# Patient Record
Sex: Female | Born: 1935 | Race: White | Hispanic: No
Health system: Southern US, Community
[De-identification: ages and names within clinical notes are randomized; demographics above are authoritative.]

---

## 1997-06-29 ENCOUNTER — Other Ambulatory Visit: Admission: RE | Admit: 1997-06-29 | Discharge: 1997-06-29 | Payer: Self-pay | Admitting: Hematology and Oncology

## 1999-01-18 ENCOUNTER — Encounter (INDEPENDENT_AMBULATORY_CARE_PROVIDER_SITE_OTHER): Payer: Self-pay | Admitting: Specialist

## 1999-01-18 ENCOUNTER — Ambulatory Visit (HOSPITAL_COMMUNITY): Admission: RE | Admit: 1999-01-18 | Discharge: 1999-01-18 | Payer: Self-pay | Admitting: Gastroenterology

## 1999-12-05 ENCOUNTER — Encounter: Admission: RE | Admit: 1999-12-05 | Discharge: 1999-12-05 | Payer: Self-pay | Admitting: Hematology and Oncology

## 1999-12-05 ENCOUNTER — Encounter: Payer: Self-pay | Admitting: Hematology and Oncology

## 2000-10-22 ENCOUNTER — Ambulatory Visit (HOSPITAL_COMMUNITY): Admission: RE | Admit: 2000-10-22 | Discharge: 2000-10-22 | Payer: Self-pay | Admitting: Hematology and Oncology

## 2000-10-22 ENCOUNTER — Encounter: Payer: Self-pay | Admitting: Hematology and Oncology

## 2001-11-06 ENCOUNTER — Inpatient Hospital Stay (HOSPITAL_COMMUNITY): Admission: AD | Admit: 2001-11-06 | Discharge: 2001-11-08 | Payer: Self-pay | Admitting: Cardiology

## 2001-11-06 ENCOUNTER — Encounter: Payer: Self-pay | Admitting: Cardiology

## 2002-04-27 ENCOUNTER — Ambulatory Visit (HOSPITAL_COMMUNITY): Admission: RE | Admit: 2002-04-27 | Discharge: 2002-04-27 | Payer: Self-pay | Admitting: Hematology & Oncology

## 2002-04-27 ENCOUNTER — Encounter: Payer: Self-pay | Admitting: Hematology & Oncology

## 2003-04-22 ENCOUNTER — Encounter (INDEPENDENT_AMBULATORY_CARE_PROVIDER_SITE_OTHER): Payer: Self-pay | Admitting: Specialist

## 2003-04-22 ENCOUNTER — Ambulatory Visit (HOSPITAL_COMMUNITY): Admission: RE | Admit: 2003-04-22 | Discharge: 2003-04-22 | Payer: Self-pay | Admitting: Gastroenterology

## 2004-03-03 ENCOUNTER — Emergency Department (HOSPITAL_COMMUNITY): Admission: EM | Admit: 2004-03-03 | Discharge: 2004-03-04 | Payer: Self-pay | Admitting: Emergency Medicine

## 2004-03-07 ENCOUNTER — Ambulatory Visit: Payer: Self-pay | Admitting: Psychiatry

## 2004-03-07 ENCOUNTER — Inpatient Hospital Stay (HOSPITAL_COMMUNITY): Admission: RE | Admit: 2004-03-07 | Discharge: 2004-03-24 | Payer: Self-pay | Admitting: Psychiatry

## 2004-03-17 ENCOUNTER — Ambulatory Visit (HOSPITAL_COMMUNITY): Admission: RE | Admit: 2004-03-17 | Discharge: 2004-03-17 | Payer: Self-pay | Admitting: Internal Medicine

## 2006-07-23 ENCOUNTER — Inpatient Hospital Stay (HOSPITAL_COMMUNITY): Admission: EM | Admit: 2006-07-23 | Discharge: 2006-07-26 | Payer: Self-pay | Admitting: *Deleted

## 2007-02-15 ENCOUNTER — Emergency Department (HOSPITAL_COMMUNITY): Admission: EM | Admit: 2007-02-15 | Discharge: 2007-02-15 | Payer: Self-pay | Admitting: *Deleted

## 2007-03-07 ENCOUNTER — Emergency Department (HOSPITAL_COMMUNITY): Admission: EM | Admit: 2007-03-07 | Discharge: 2007-03-07 | Payer: Self-pay | Admitting: Emergency Medicine

## 2009-08-04 IMAGING — CR DG ABDOMEN ACUTE W/ 1V CHEST
3 series · 3 of 3 positions shown · non-contrast
Comparison: Chest, 03/17/2004

CLINICAL DATA: Chest pain.  
 ACUTE ABDOMINAL SERIES ? 3 VIEW:

[w chest pa]
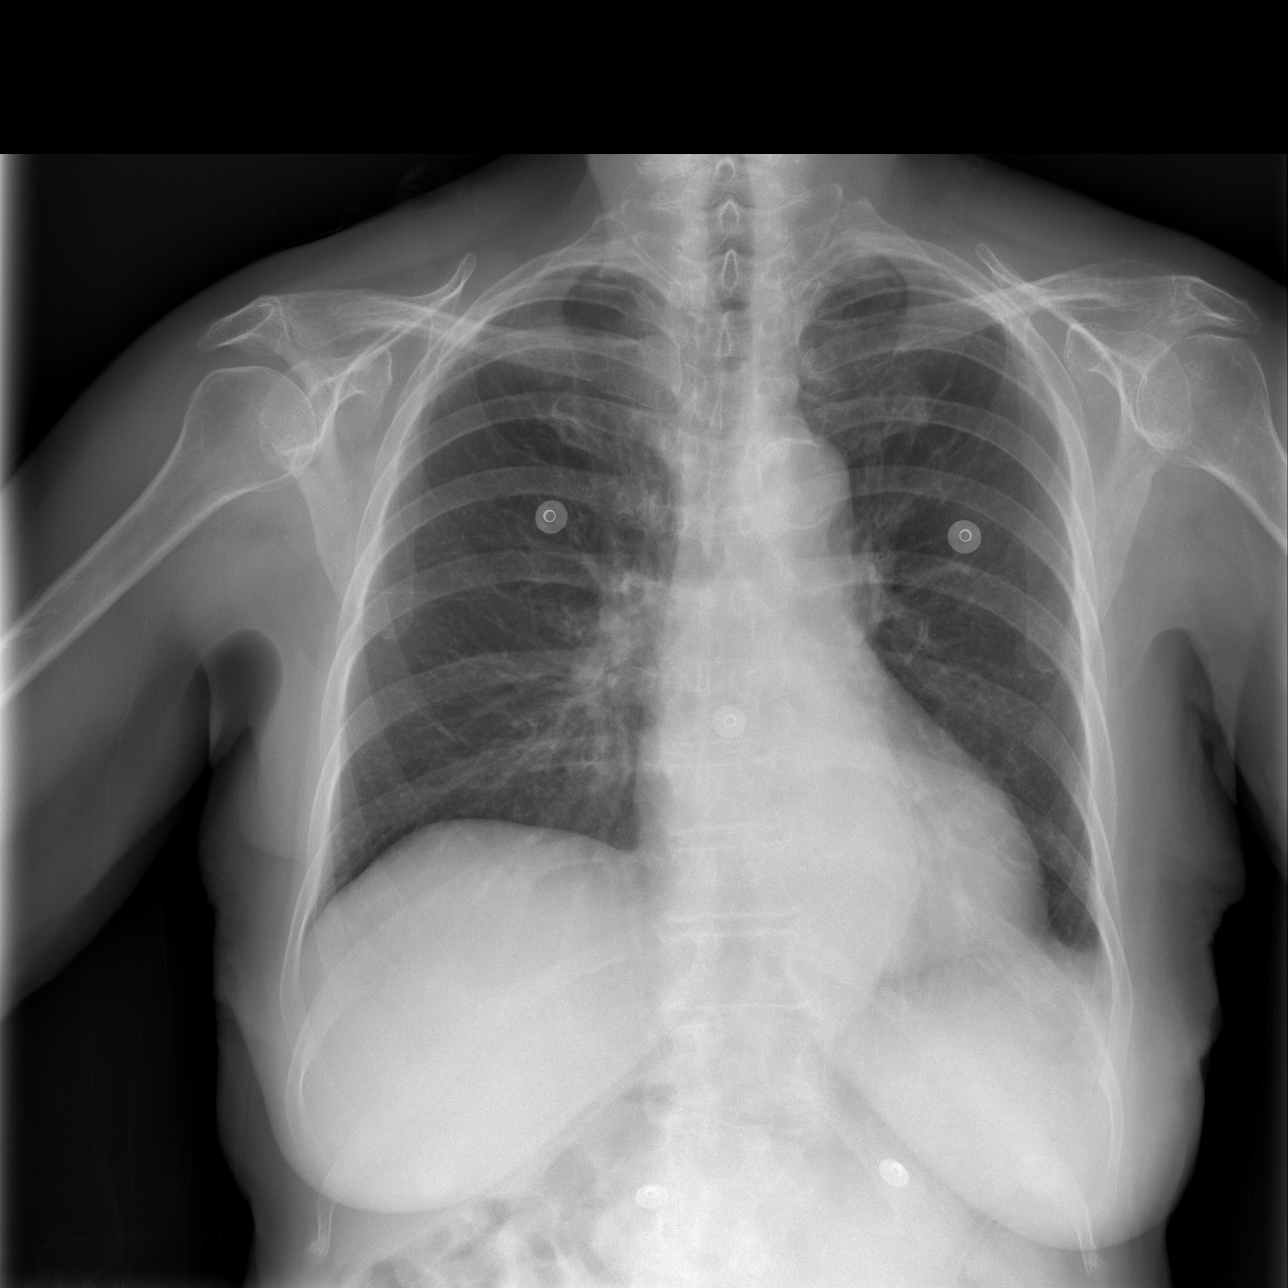

[w abdomen upright *]
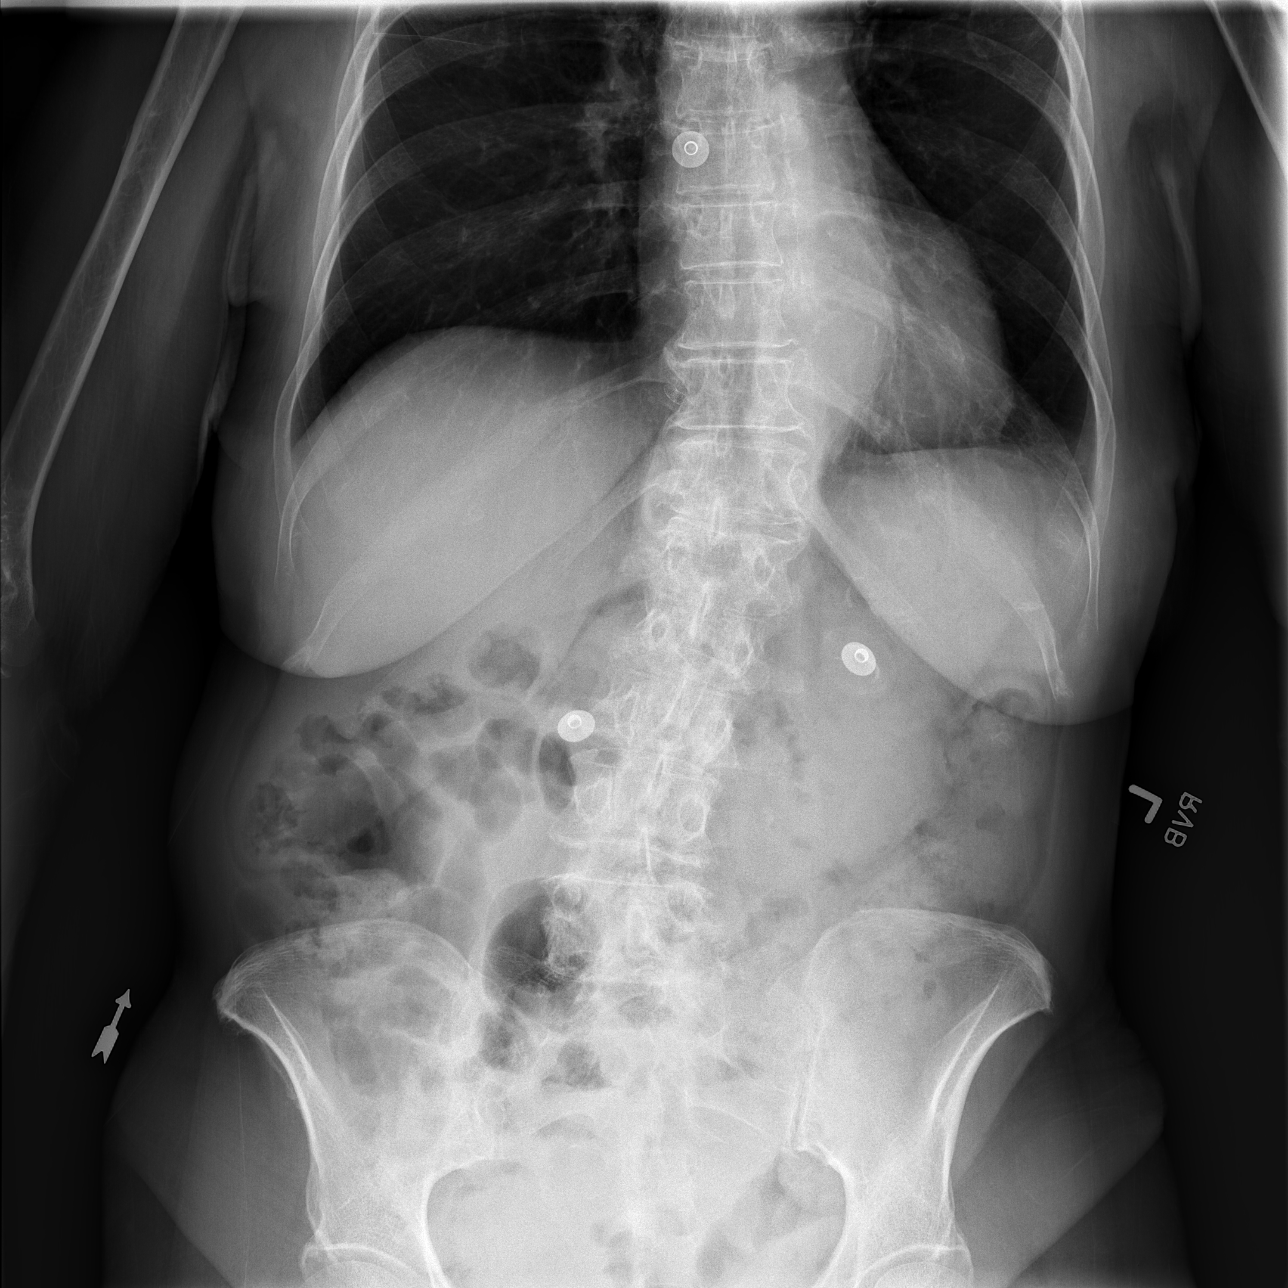

[t abdomen supine]
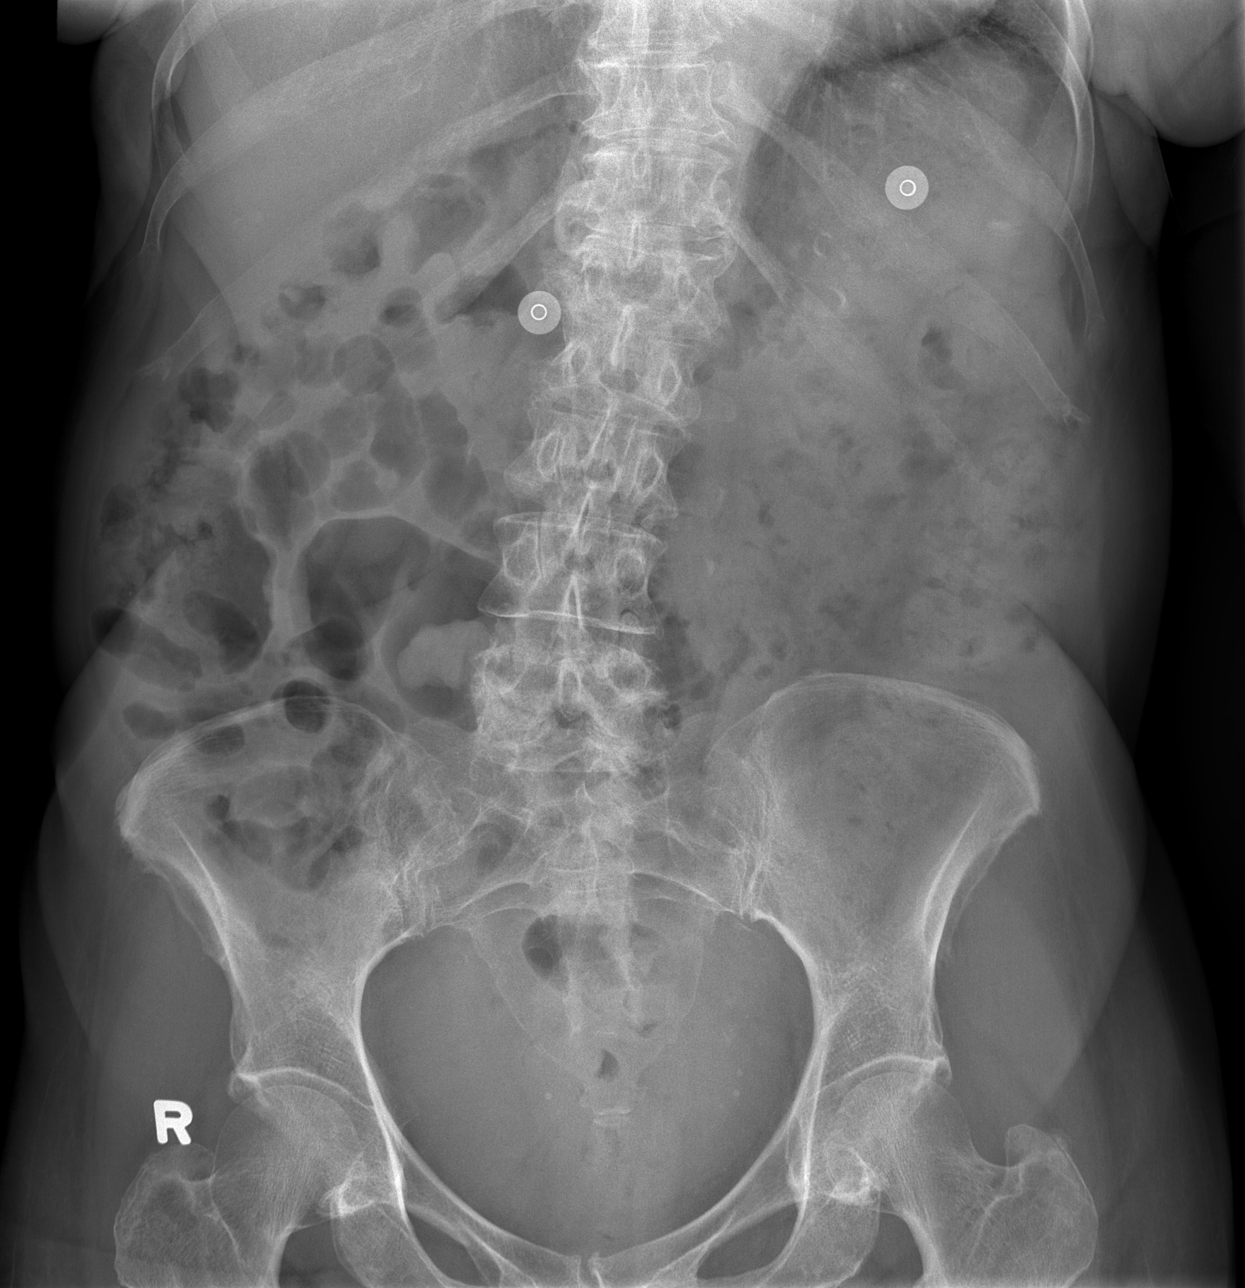

[3 of 3 positions shown; findings below may reference images not displayed]

FINDINGS: No infiltrate, congestive heart failure, or pneumothorax.  Calcified tortuous aorta.  Heart size top normal. Nonspecific bowel gas pattern without evidence of obstruction or free intraperitoneal air.  Levoscoliosis thoracolumbar junction.
IMPRESSION: No      obstruction or free intraperitoneal air.  
  Calcified      tortuous aorta. 
  Levoscoliosis,      thoracolumbar junction.

## 2009-08-24 IMAGING — CT CT HEAD W/O CM
1 series · 16 of 30 positions shown, 20 images · IV contrast (agent unspecified)
Comparison: 02/15/07.

CLINICAL DATA: Altered level of consciousness.  
 HEAD CT WITHOUT CONTRAST:
TECHNIQUE: Contiguous axial images were obtained from the base of the skull through the vertex according to standard protocol without contrast.

[Series 2: headseq 4.8 h45s · axial · 0.43mm/px · z∈[+1214,+1347]mm · 16 of 30 slices shown, 20 images]
[im 2/30  brain]
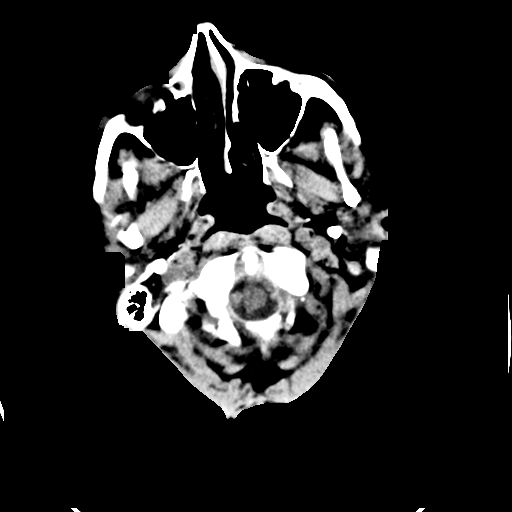
[im 2/30  bone]
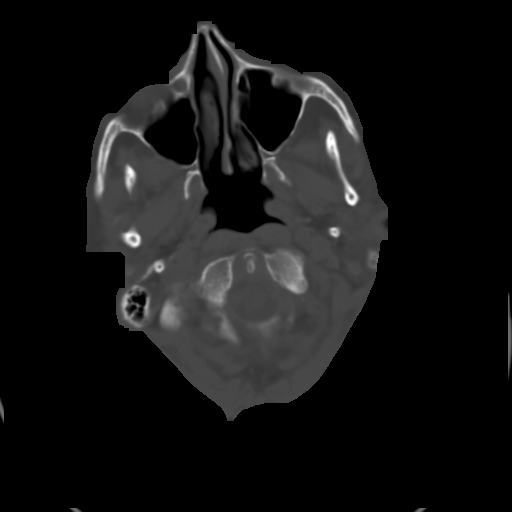
[im 4/30  brain]
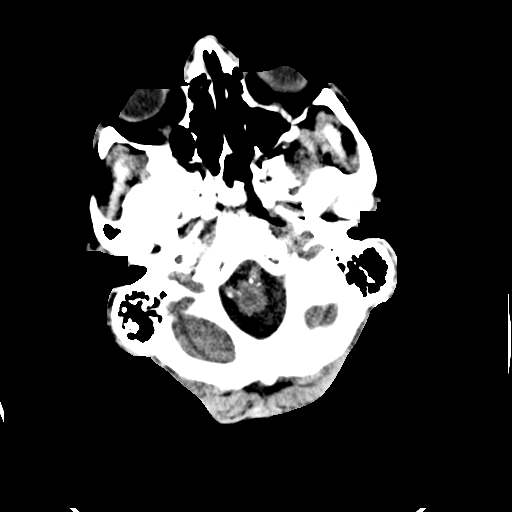
[im 6/30  brain]
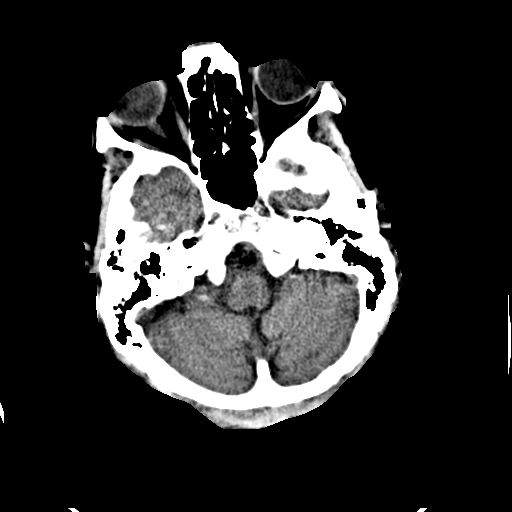
[im 8/30  brain]
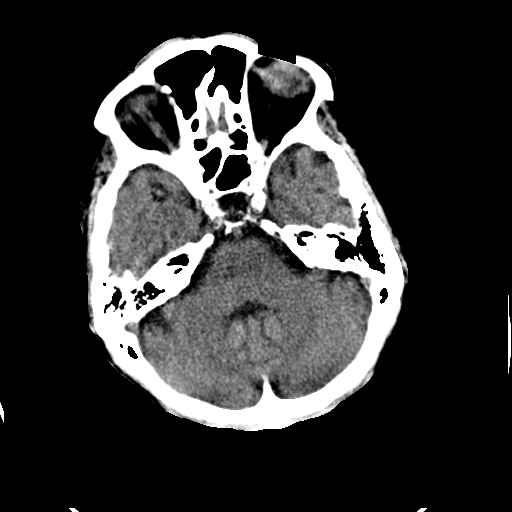
[im 9/30  brain]
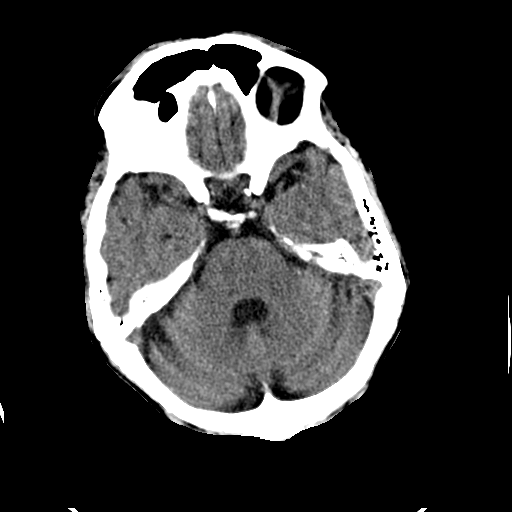
[im 9/30  bone]
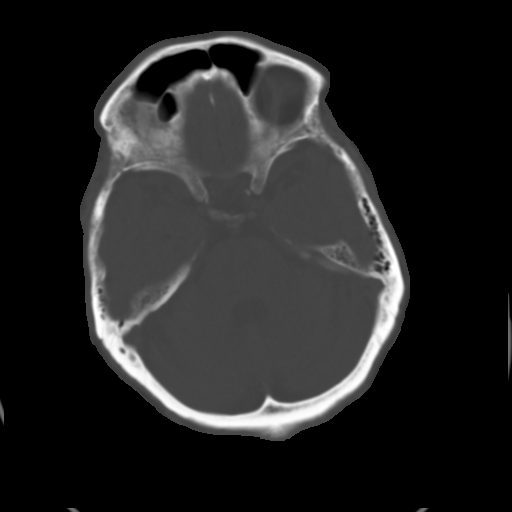
[im 11/30  brain]
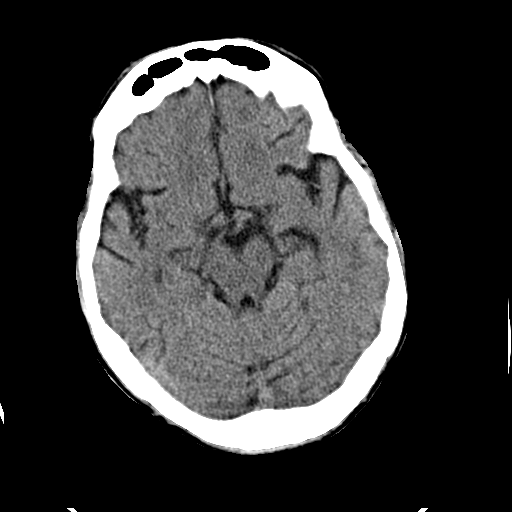
[im 13/30  brain]
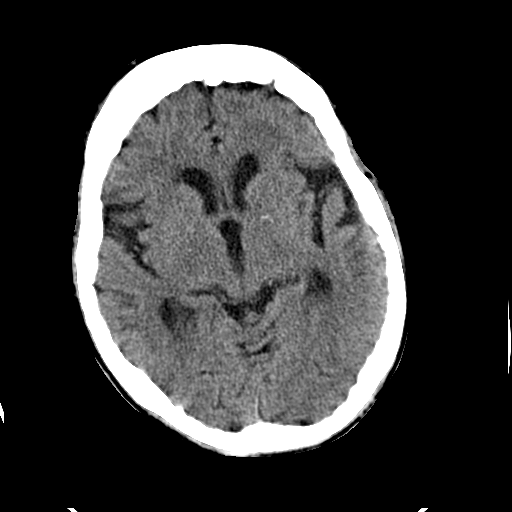
[im 15/30  brain]
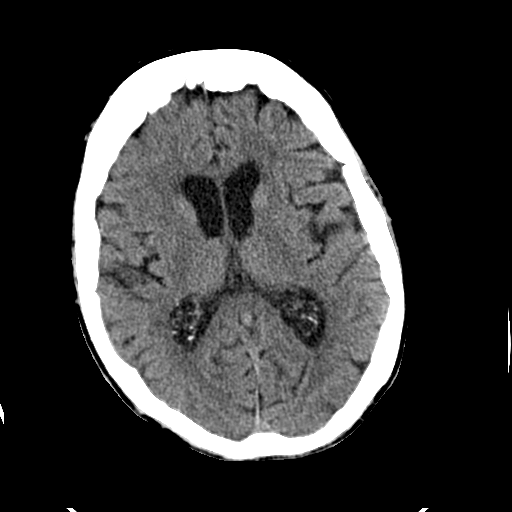
[im 16/30  brain]
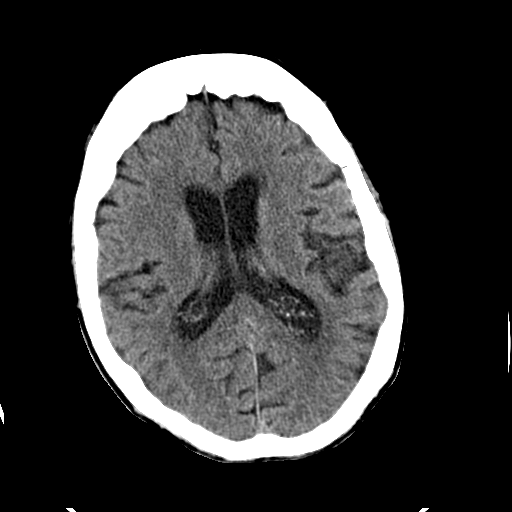
[im 16/30  bone]
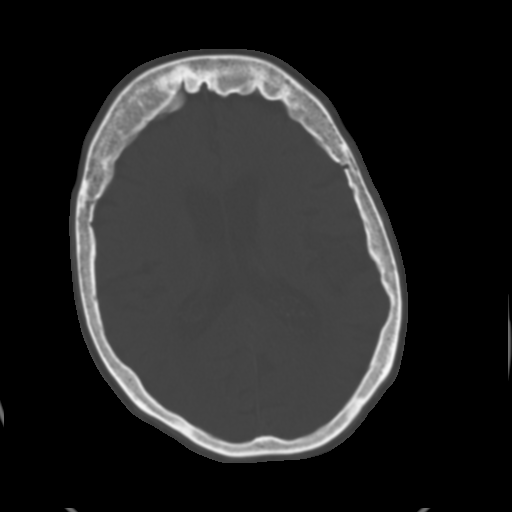
[im 18/30  brain]
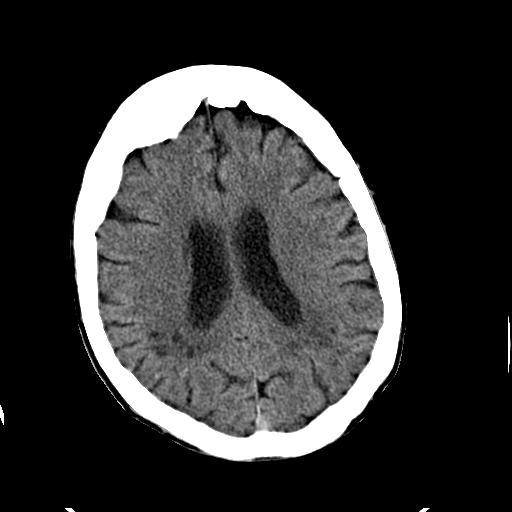
[im 20/30  brain]
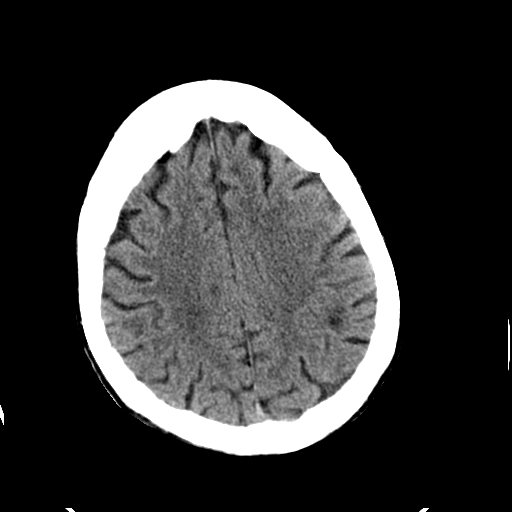
[im 22/30  brain]
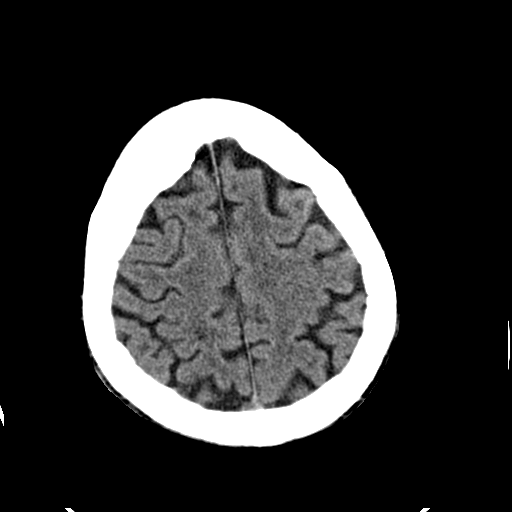
[im 23/30  brain]
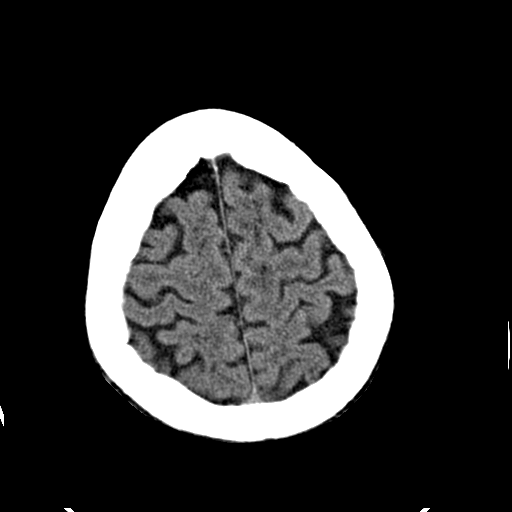
[im 23/30  bone]
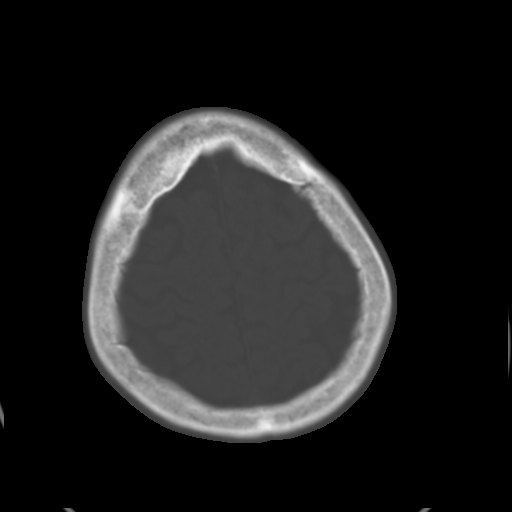
[im 25/30  brain]
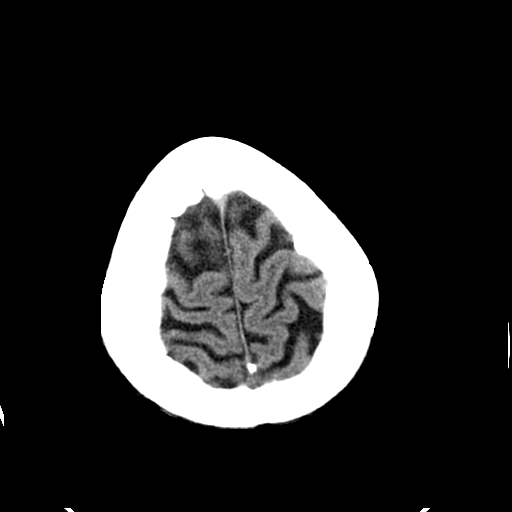
[im 27/30  brain]
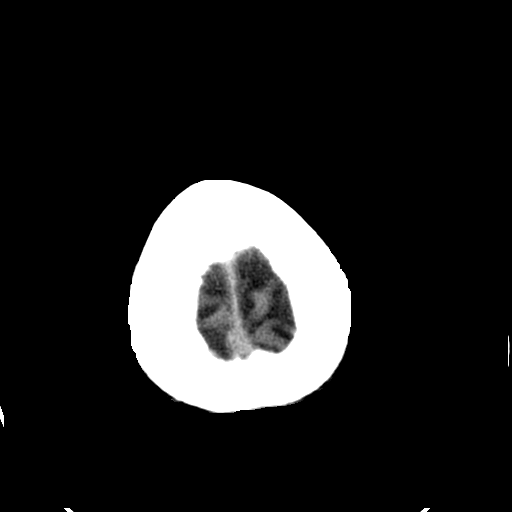
[im 29/30  brain]
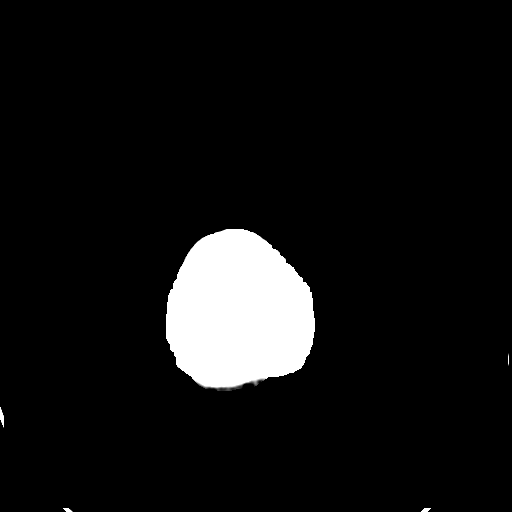

[16 of 30 positions shown; findings below may reference images not displayed]

FINDINGS: Cortical volume loss and periventricular white matter hypodensity is stable.  No acute infarct, hemorrhage or mass is seen.  Posterior fossa is unremarkable.  Orbits and paranasal sinuses are intact.  No skull fracture.
IMPRESSION: No acute intracranial findings.  Chronic findings as above, unchanged.

## 2010-04-08 ENCOUNTER — Encounter (HOSPITAL_COMMUNITY): Payer: Self-pay | Admitting: Psychiatry

## 2010-08-04 NOTE — Consult Note (Signed)
[name], Meghan               ACCOUNT NO.:  [id number]   MEDICAL RECORD NO.:  [id number]          PATIENT TYPE:  INP   LOCATION:  1605                         FACILITY:  [facility]   PHYSICIAN:  Antonietta Breach, M.D.  DATE OF BIRTH:  [birth year]   DATE OF CONSULTATION:  07/24/2006  DATE OF DISCHARGE:  07/26/2006                                 CONSULTATION   REASON FOR CONSULTATION:  Mental status changes including  hallucinations, assess capacity, recommend treatment.   HISTORY OF PRESENT ILLNESS:  Meghan Porter is an 77 year old female  admitted to the [facility] on Jul 23, 2006, for inability to  walk and feeling weak.   Meghan Porter has had approximately 2 weeks of worsening confusion on top  of a long-term memory impairment.  She has been started on Aricept  recently at 5 mg daily   The daughter stopped her Aricept due to due to concerns about the  Aricept contributing to her weakness and dysphoria.  Because the  patient's confusion continued to worsen, she was brought in to the  emergency room.  She is now admitted to the medical ward.   In addition to confusion, the patient has had expressive dysphasia.  She  also has had inability to walk with a shuffled gait and minor fall.   The patient does continue with hallucinations as well as partially  disorganized thought process.  She does have significant memory  impairment.   MEDICATIONS:  1. Risperdal 1 mg daily.  2. Depakote 250 mg daily.  3. Zoloft 100 mg daily.  4. Ambien 5 mg q.h.s.  5. Ativan 0.5 to 1 mg p.r.n. anxiety/agitation.   She is not having any adverse psychotropic effects.   PAST PSYCHIATRIC HISTORY:  The past medical record is reviewed.  The  patient has a history of depression.  It has been treated with  antidepressant therapy in the past.  She has been on Zoloft at least as  far back as August 2003 in the medical record.  At that time, she was  also on Xanax.   She was admitted to the  [facility] in December 2005  with confusion, increased anxiety and obsessions about something  happening to a loved one.  She was stabilized on Risperdal, Zoloft,  Xanax.   FAMILY PSYCHIATRIC HISTORY:  None known   SOCIAL HISTORY:  The patient is widowed.  She lived alone until  recently.  Occupation:  Retired Catering manager.  Religion:  [employer].  She  has a daughter who looks in on her.  She does not have any history of  alcohol or illegal drug use.   PAST MEDICAL HISTORY:  1. 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  2. There is a presumed heart dyskinesia related to the use of      antipsychotic medication.  3. Hypertension.  4. Hyperlipidemia.  5. Diabetes.  6. Coronary artery disease with a history of stenting.   MEDICATIONS:  The MAR is reviewed.  Psychotropics include those  discussed in the history of present illness.   ALLERGIES:  ASPIRIN.  LABORATORY DATA:  WBC 7.5, hemoglobin 12.3, platelet count 283, INR 1.1,  BUN 33, creatinine 1.37, SGOT 35, SGPT 22, albumin 4.1, calcium 9.4.  TSH within normal limits.  Head CT without contrast shows likely  atrophy.  An MRI without contrast of the brain showed atrophy.   REVIEW OF SYSTEMS:  CONSTITUTIONAL:  Afebrile.  There is no current  weight loss.  HEAD:  No trauma.  EYES:  No visual changes.  EARS:  No  hearing impairment NOSE:  No rhinorrhea.  MOUTH/THROAT:  No sore throat.  NEUROLOGIC:  As above.  PSYCHIATRIC: The the patient does have a history  of recurrent psychosis which has involved paranoid delusions.  She has  been followed by an outpatient psychiatrist.  She also has a history of  memory loss and loss of judgment.  CARDIOVASCULAR:  No chest pain or  palpitations.  RESPIRATORY:  No coughing or wheezing.  GASTROINTESTINAL:  No nausea, vomiting, diarrhea.  GENITOURINARY:  No dysuria.  SKIN:  Unremarkable.  HEMATOLOGICAL/LYMPHATIC:  Unremarkable.  MUSCULOSKELETAL:  No deformities.    PHYSICAL EXAMINATION:  VITAL SIGNS:  Temperature 97.4, pulse 73,  respirations 18, blood pressure 157/105, O2 saturation on room air 94%.  GENERAL APPEARANCE:  Meghan Porter is an elderly female lying in a supine  position in her hospital bed with no abnormal movements.  She has a  slightly decreased psychomotor tone   MENTAL STATUS EXAM:  [name] is alert.  She has a decreased attention  span.  On orientation testing she does not know the year or the month.  She does know the place.  Memory testing 3/3 immediate, only 1/3 at 3  minutes.  She has difficulty with word finding.  Her fund of knowledge  and intelligence are below that of her estimated premorbid baseline.  Her speech involves normal rate and prosody without dysarthria.   Concentration is mildly decreased.  Thought process involves partial  confabulation without looseness of associations.  Her thought content is  remarkable for hallucinations.  There are no delusions.  She has no  thoughts of harming herself or others.  Her judgment is impaired.  Her  insight is poor.   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  (296.35)  Major depressive disorder recurrent in partial remission.  (294.9)  Unspecified persistent mental disorder NOS.  The patient has a  form of dementia.  AXIS II:  None  AXIS III:  See general medical problems above.  AXIS IV:  General medical and primary support group.  AXIS V:  40.   Meghan Porter does have critical deficits in judgment as well as reasoning.  She also has significant difficulty with memory.  She does not have the  capacity for informed consent.   RECOMMENDATIONS:  It is assumed at this time that the patient was not  taking adequate Risperdal at home.  Therefore, would continue the  current Risperdal dosage of 1 mg daily which should become effective for  antipsychosis.  The augmentation of Risperdal with Depakote specifically  for  anti-agitation can decrease the amount of neuroleptic needed  allowing Risperdal to only be for psychotic symptoms.   Would continue Zoloft 100 mg q.a.m. for antidepression.   Would periodically monitor liver function tests and a CBC for Depakote  screening.   With Risperdal, would monitor glucose periodically as well as lipid  profile.  Would also perform an abnormal involuntary movement assessment  periodically and  attempt to decrease the Risperdal to the lowest  effective dosage which allows prevention of psychotic episodes.   PRELIMINARY DISCHARGE PLANNING:  The patient will follow up with her  outpatient psychiatrist.      Antonietta Breach, M.D.  Electronically Signed     JW/MEDQ  D:  09/25/2006  T:  09/26/2006  Job:  [id number]

## 2010-08-04 NOTE — Discharge Summary (Signed)
Meghan Porter, Meghan Porter               ACCOUNT NO.:  [id number]   MEDICAL RECORD NO.:  [id number]          PATIENT TYPE:  INP   LOCATION:  1605                         FACILITY:  [facility]   PHYSICIAN:  Barry Dienes. [name], M.D.DATE OF BIRTH:  1921-09-05   DATE OF ADMISSION:  07/23/2006  DATE OF DISCHARGE:                               DISCHARGE SUMMARY   DATE OF ADMISSION:  07/23/2006.   ANTICIPATED DATE OF DISCHARGE:  07/26/2006.   PERTINENT FINDINGS:  Patient is an 77 year old white female who is well  known to me as I severe as her primary care physician. Last week she was  started on Aricept 5 mg daily do to worsening confusion and  forgetfulness.  At her office visit last week with her daughter it was  advised that if she had any problems with the Aricept and mental status  that she should stop the medication and call us.  Initially, she was  doing fine on Aricept 5 mg every p.m.  Yesterday she was somewhat weak  with dysphoria so Aricept was stopped.  On the day of admission her  daughter went to see her after work and noted that she had increase in  confusion with difficulty expressing words and a great deal of  difficulty with walking with a very shuffled gait and minor fall in her  presence without injury.  She presented to the [facility] Emergency  Room for evaluation.   See her admission history and physical for details of  her past medical  history.   MEDICATIONS PRIOR TO ADMISSION:  Atenolol 25 mg daily, Avalide 150/12.5  one tab p.o. daily, Protonix 40 mg daily, Glucophage-XR 500 mg daily,  Zoloft 100 mg daily, Cogentin 1 mg daily, Depakote ER 250 mg daily,  Restoril 15 mg p.o. q.h.s., Lipitor 20 mg daily, Risperdal 1 mg p.o.  q.p.m., aspirin 81 mg daily, multivitamin 1 tab daily, Eye caps 1 tab  daily, Aricept 5 mg every p.m. it was stopped on 05/05.   ALLERGIES:  NO KNOWN DRUG ALLERGIES.   INITIAL PHYSICAL EXAMINATION:  VITAL SIGNS:  Blood pressure 159/81,  pulse  76, respirations 18, temperature 98.1, pulse oxygen saturation 95%  on room air.  GENERAL:  She is an elderly white female who is no apparent distress  while laying fully supine in bed.  HEENT:  Within normal limits.  NECK:  Supple without jugular venous distention or carotid bruits.  CHEST:  Clear to auscultation.  HEART:  Regular rate and rhythm without significant murmur or gallop.  ABDOMEN:  Normal bowel sounds and no hepatosplenomegaly or tenderness.  EXTREMITIES:  Bilateral warmth, leg edema and pedal pulses were 1+  bilaterally.  NEUROLOGIC:  She was alert and mildly anxiously with mild word finding  difficulty.  She had a rest tremor in both upper extremities.  She was  able to move 4 extremities with normal strength.  She had intact finger  to nose testing on the right and left.  Gait was not assessed.   INITIAL LABORATORY STUDIES:  White blood cell count 7.5, hemoglobin  12.3, hematocrit 36, platelet  283, serum sodium 137, potassium 3.4,  chloride 98, carbon dioxide 29, BUN 33, creatinine 1.37, glucose 145.  Total protein 6.9, albumin 4.1, total bilirubin 1.0, calcium 9.4,  estimated glomerular filtration rate 45.  A CT scan of the head without  contrast showed no acute abnormalities, but did show global moderate  atrophy.  EKG telemetry showed a normal sinus rhythm.   HOSPITAL COURSE:  The patient was admitted to a medical bed without  telemetry.  She was seen by a neurology consultant and a psychiatric  consultant.  The neurology consultant, Kelli Hope, felt that she  had transient encephalopathy do to medications and recommended that we  rule out other possibilities such as urinary tract infection or acute  renal insufficiency.  They also recommend the addition of Thymin to her  medications.  If possible, he recommended tapering off of Cogentin and  Risperdal and continued physical therapy and occupational therapy to  improve to her gait.  The psychiatry consultant,  Antonietta Breach, noted  that she did not have the ability to give informed consent do to  dementia.  He felt that it would be reasonable to try to increase  Depakote and attempt to taper her off of Risperdal given possible side  effects from these medications.   PROCEDURES:  CT scan of the head which showed no acute abnormalities  with global atrophy and MRI of the brain that showed the following:  1. No acute stroke.  2. Moderate global atrophy.   She is also seen by physical therapy consultant who noted that she  needed maximal assistance to transfer from the bed to a wheelchair and  was only able to ambulate 3 feet with a walker and moderate assistance.  He recommended skilled nursing facility rehabilitation.   COMPLICATIONS:  None.   CONDITION ON DISCHARGE:  She was calm and slept well on Depakote 500 mg.   MOST RECENT PHYSICAL EXAMINATION:  VITAL SIGNS:  Blood pressure 142/90,  pulse 66, respirations 18, temperature 96.7, pulse oxygen saturation 96%  on room air.  GENERAL:  She is an elderly white female who is in no apparent distress  while laying full supine.  HEENT:  Within normal limits.  NECK:  Supple without jugular venous distention or carotid bruit.  CHEST:  Clear to auscultation.  HEART:  Had a regular rate and rhythm without significant murmur or  gallop.  ABDOMEN:  Normal bowel sounds and no hepatosplenomegaly or tenderness.  EXTREMITIES:  Without cyanosis, clubbing or edema.  NEUROLOGIC:  She was alert and oriented x 1, her speech was normal and  she was not having paranoid ideation.  She could move all 4 extremities  and no longer had a tremor.  Cerebellar function and gait were not re-  assessed.   MOST RECENT LABORATORY TEST:  Include white blood cell count 7.5,  hemoglobin 12.3, hematocrit 36, platelet 283, serum sodium 142,  potassium 3.3, chloride 108, carbon dioxide 28, BUN 12, creatinine 1.0, glucose 143, total protein 6.4, albumin 3.3, serum amnion  less than 8.  Urinalysis was nitrite negative with white blood cells negative and  bacteria negative.  TSH 3.61, most recent capillary blood glucose levels  were 147,142, 129.   DISCHARGE DIAGNOSES:  1. Gait instability, status post fall.  2. Physical deconditioning.  3. Tremor possibly do to Risperdal.  4. Advanced dementia.  5. Hypertension, essential, controlled.  6. Hypokalemia, presumed do to hydrochlorothiazide.  7. Gastroesophageal reflux disease.  8. Diabetes mellitus, type 2,  controlled.  9. Malnutrition.  10.Depression.  11.Insomnia.  12.Hyperlipidemia.  13.Psychotic disorder, NOS.  14.Liable mood.  15.Possible early tardive dyskinesia, improved.  16.Osteoarthritis.  17.Coronary artery disease.  18.Vitamin D Deficiency.  19.Irritable bowel syndrome.  20.Hypertension.  21.History of non-Hodgkin's lymphoma, no recurrence.  22.Anxiety.   DISCHARGE MEDICATIONS:  Atenolol 25 mg p.o. b.i.d., Avapro 150 mg p.o.  daily, Prilosec 20 mg p.o. daily, Glucophage-XR 500 mg p.o. q.p.m.,  multivitamin 1 tab p.o. daily, Eye caps 1 tab p.o. daily, Zoloft 100 mg  p.o. daily, Ambien 5 mg p.o. q.h.s., Zocor 40 mg p.o. daily, Risperdal  0.5 mg p.o. q.p.m. with possible tapering at the skilled nursing  facility, Cogentin 1 mg p.o. daily with possible tapering at the nursing  facility, Depakote ER 500 mg p.o. daily at 6 p.m., Thymin 100 mg p.o.  daily, Tylenol 650 mg p.o. t.i.d. p.r.n. pain, Ecotrin 81 mg p.o. daily,  NovoLog sliding scale insulin-ac t.i.d. as follows:  For capillary blood  glucose greater than 250 give 4 units of NovoLog subcutaneous, vitamin D  50,000 units p.o. once weekly.   DISPOSITION AND FOLLOWUP:  Patient will be transferred to a skilled  nursing facility for physical therapy and occupational therapy as well  as speech pathology evaluation to optimize her gait instability and  physical deconditioning.  She should have a followup appointment with  Dr. Jarome Matin at [facility] at telephone [phone]  following completion of her skilled nursing facility rehabilitation.  If  she is significantly impaired following acute skilled nursing facility  rehabilitation strong consideration should given to a transfer to an  assisted living facility.  Note that her point of contact is her  daughter Buel Ream at cell telephone number [phone] and home telephone  number [phone].           ______________________________  Barry Dienes. [name], M.D.     DGP/MEDQ  D:  07/26/2006  T:  07/26/2006  Job:  [id number]

## 2010-08-04 NOTE — Op Note (Signed)
NAMERANATA, Meghan Porter                         ACCOUNT NO.:  [id number]   MEDICAL RECORD NO.:  [id number]                   PATIENT TYPE:  INP   LOCATION:  4731                                 FACILITY:  [facility]   PHYSICIAN:  Florencia Reasons, M.D.             DATE OF BIRTH:  [birth year]   DATE OF PROCEDURE:  11/08/2001  DATE OF DISCHARGE:                                 OPERATIVE REPORT   PROCEDURE:  Upper endoscopy.   INDICATIONS:  This 77 year old female was admitted to the hospital with  chest pain several days ago.  She has a known history of coronary artery  disease and is previously status post stent placement; however, cardiac  catheterization on this admission did not disclose any significant blockages  to account for her pain.  Of note, the patient has a history of reflux  disease, for which she is maintained on an H2 blocker, and also she is  status post radiation therapy to the chest for a previous history of non-  Hodgkin's lymphoma, approximately five years ago.  Rule out reflux  esophagitis or radiation esophagitis.   FINDINGS:  Small hiatal hernia, otherwise normal exam.  No source of chest  pain endoscopically evident.   DESCRIPTION OF PROCEDURE:  The nature, purpose, and risks of the procedure  had been discussed with the patient, who provided written consent.  Sedation  was fentanyl 25 mcg and Versed 2.5 mg IV without arrhythmias or  desaturation.  The Olympus small-caliber adult video endoscope was passed  under direct vision, encountering slight difficulty getting past the  cricopharyngeus due to absence of patient cooperation with swallowing.  The  vocal cords appeared normal, although the laryngeal tissues around them  appeared somewhat boggy.   The esophagus was endoscopically normal and specifically without any source  of chest pain being endoscopically evident.  I saw no evidence of reflux  esophagitis or Barrett's esophagus, nor was there free reflux  occurring  during the exam.  There was no evidence of any radiation esophagitis nor any  varices, infection, or neoplasia.  The contractility of the esophagus was  probably normal, although every now and then there was a hint of some  corkscrewing suggesting possible esophageal spasm.   At the squamocolumnar junction was a widely patent esophageal ring, and  below this was a 2 cm hiatal hernia.  The stomach contained no significant  residual and had normal mucosa without evidence of gastritis, erosions,  ulcers, polyps, or masses, and a retroflexed view of the proximal stomach  was normal except for the hiatal hernia as seen from its inferior  perspective.  The pylorus, duodenal bulb, and second duodenum looked normal.   The scope was removed from the patient, who tolerated the procedure well and  without apparent complication.  No biopsies were obtained.    IMPRESSION:  Unremarkable exam.  No endoscopically-evident source of chest  pain seen.  PLAN:  No further GI evaluation is warranted at the present time.                                                Florencia Reasons, M.D.    RVB/MEDQ  D:  11/08/2001  T:  11/11/2001  Job:  [id number]   cc:   Barry Dienes. [name], M.D.   Runell Gess, M.D.   Lowell C. Catha Gosselin, M.D.  [address] [facility]  [place]  [place] [address]  Fax: [phone]   Petra Kuba, M.D.

## 2010-08-04 NOTE — Cardiovascular Report (Signed)
Meghan Porter, Meghan Porter                         ACCOUNT NO.:  [id number]   MEDICAL RECORD NO.:  [id number]                   PATIENT TYPE:  INP   LOCATION:  4731                                 FACILITY:  [facility]   PHYSICIAN:  Madaline Savage, M.D.             DATE OF BIRTH:  05/30/1921   DATE OF PROCEDURE:  11/07/2001  DATE OF DISCHARGE:                              CARDIAC CATHETERIZATION   PROCEDURES PERFORMED:  1. Selective coronary angiography by Judkins technique.  2. Retrograde left heart catheterization.  3. Left ventricular angiography.  4. Abdominal aortography.   COMPLICATIONS:  None.   ENTRY SITE:  Right femoral.   DYE USED:  Omnipaque.   PATIENT PROFILE:  The patient is a 77 year old female patient of Dr. Gery Pray,  who has had basically several months of stable chest pain sounding anginal  in the last two weeks episodes of accelerated and are definitely relieved  with sublingual nitroglycerin. The patient is diabetic and has a history of  non-Hodgkin lymphoma.   The patient had a tight lesion in her right coronary artery distally in 1998  and on February 03, 1997, underwent implantation of a 3.5 x 13 mm Multi-Link  Duet stent by Dr. Tresa Endo with excellent results.  Today, the patient enters  the catheterization lab on an inpatient basis. She was admitted yesterday  for unstable symptoms and was placed on IV nitroglycerin and heparin.   RESULTS:  PRESSURES:  The left ventricular pressure today was 155/17,  central aortic pressure 155/65, mean of 101.  No aortic valve gradient by  pullback technique.   ANGIOGRAPHIC RESULTS:  The left main coronary artery was normal.   The LAD coursed to the cardiac apex and gave rise to three very small  diagonal branches. The LAD just before the septal perforator branch  contained a subtle area of 40% lesion that appeared unchanged when compared  to old films dating back to November 1998. There was a large septal  perforator  branch immediately after the lesion that appeared normal.   The left circumflex is nondominant. One distal anterolateral branch arose.  No lesions were seen in the circumflex.   The right coronary artery was large and dominant. There was a 30% stenosis  in a midportion of a vessel unchanged from November 1998.  There was a  radioopaque stent which showed no in-stent re-stenosis whatsoever, and  showed excellent TIMI-3 class distal flow into a PDA and PLA that also  appeared normal.   ABDOMINAL AORTOGRAPHY:  Abdominal aortography was normal.  The right renal  artery contained a 30% ostial stenosis. The left renal artery was normal.   LEFT VENTRICULAR ANGIOGRAPHY:  Left ventricular angiography showed normal  contractility. Ejection fraction estimated of 65-70% and no evidence of  mitral regurgitation.   FINAL DIAGNOSES:  1. Trivial two-vessel coronary artery disease.     a. A 40% proximal left anterior descending.  b. A 30% mid right coronary artery.     c. Widely patent stent to the distal right coronary artery.  2. Excellent left ventricular function.   RECOMMENDATIONS:  The patient should be reassured about her heart.  A  gastrointestinal evaluation is recommended.                                                  Madaline Savage, M.D.    WHG/MEDQ  D:  11/07/2001  T:  11/10/2001  Job:  [id number]   cc:   Runell Gess, M.D.   Cardiac Catheterization

## 2010-08-04 NOTE — Op Note (Signed)
[name], Meghan Porter                         ACCOUNT NO.:  [id number]   MEDICAL RECORD NO.:  [id number]                   PATIENT TYPE:  AMB   LOCATION:  ENDO                                 FACILITY:  [facility]   PHYSICIAN:  Petra Kuba, M.D.                 DATE OF BIRTH:  [birth year]   DATE OF PROCEDURE:  04/22/2003  DATE OF DISCHARGE:                                 OPERATIVE REPORT   PROCEDURE:  Colonoscopy with hot biopsy.   INDICATION:  Patient with history of colon polyps, due for a repeat  screening.  Consent was signed after risks, benefits, methods, options  thoroughly discussed in the office in the past.   MEDICINES USED:  1. Demerol 70.  2. Versed 5.   DESCRIPTION OF PROCEDURE:  Rectal inspection was pertinent for external  hemorrhoids, small.  Digital exam was negative.  The video pediatric  adjustable colonoscope was inserted and fairly easily advanced around the  colon to the cecum.  This did require rolling her on her back and some  abdominal pressures.  No abnormality was seen on insertion.  The cecum was  identified by the appendiceal orifice and the ileocecal valve.  The scope  was slowly withdrawn.  The prep was adequate.  There was some liquid stool  that required washing and suctioning.  On slow withdrawal back to the left  side of the colon, no right-sided abnormalities were seen.  However, in the  proximal along with the mid descending, a small polyp was seen.  It seemed  to be right next to a cautery burn from before, and possibly was a residual  polyp from her previous colonoscopy.  It was roughly in the same place based  on the dictation.  It was again hot biopsied x 1.  No obvious residual  polypoid tissue was seen.  The scope was then slowly withdrawn.  No  additional findings were seen as we slowly withdrew back to the rectum.  Anorectal pull-through and retroflexion confirmed some small hemorrhoids.  The scope was reinserted a short ways up the left  side of the colon; air was  suctioned, scope removed.  The patient tolerated the procedure well.  There  was no obvious immediate complication.   ENDOSCOPIC DIAGNOSES:  1. Internal-external hemorrhoids.  2. Tiny descending polyp, hot biopsied, questionable residual from polyp     seen in the past since cautery burn we believe was seen.  3. Tortuous colon.  4. Otherwise within normal limits to the cecum.   PLAN:  Await pathology but based on her age and other medical problems,  question whether repeat screening is indicated.  Would be happy to discuss  in the future with Dr. [name].  Otherwise, return care to him for the  customary health care maintenance, and I will be on standby to help p.r.n.  and leave yearly rectals, guaiacs, etc., to him.  Petra Kuba, M.D.    MEM/MEDQ  D:  04/22/2003  T:  04/22/2003  Job:  [id number]   cc:   Barry Dienes. [name], M.D.  [address]  [place]  [place] [address]  Fax: [phone]

## 2010-08-04 NOTE — Discharge Summary (Signed)
Meghan Porter, Meghan Porter NO.:  [id number]   MEDICAL RECORD NO.:  [id number]          PATIENT TYPE:  IPS   LOCATION:  0405                          FACILITY:  [facility]   PHYSICIAN:  Jeanice Lim, M.D. DATE OF BIRTH:  28-Jun-1921   DATE OF ADMISSION:  03/07/2004  DATE OF DISCHARGE:  03/24/2004                                 DISCHARGE SUMMARY   IDENTIFYING DATA:  This is a 77 year old Caucasian female, involuntarily  admitted, presenting with a history of confusion, increased anxiety,  thinking something may happen to love one, states recent problem with  kidney, was put on antibiotic, unreliable historian.  Second  hospitalization, last one 10 years ago.   ADMISSION MEDICATIONS:  Zoloft for 2 years, stopped and then was restarted,  Lipitor, Glucophage, Atenolol and Haldol.   ALLERGIES:  ASPIRIN.   PHYSICAL EXAMINATION:  Within normal limits, neurologically nonfocal.   ROUTINE ADMISSION LABS:  Within normal limits.   MENTAL STATUS EXAM:  Alert, cooperative, good eye contact, casually dressed.  Speech clear, anxious, thought processes some clear confusion with distorted  reality testing and perseverating on fear of something happening to family,  again poor historian.  Judgment and insight were impaired.   ADMISSION DIAGNOSES:   AXIS I:  1.  Psychotic disorder not otherwise specified, rule out delirium.  2.  History of major depressive disorder, recurrent, severe, with psychotic      features.   AXIS II:  Deferred.   AXIS III:  Irritable bowel syndrome, coronary artery disease and elevated  cholesterol, non-insulin-dependent diabetes mellitus and gastroesophageal  reflux disease.   AXIS IV:  Severe problems with primary support group, medical problems,  other psychosocial issues.   AXIS V:  30/60.   HOSPITAL COURSE:  The patient was admitted and ordered routine p.r.n.  medications, underwent further monitoring, and was encouraged to participate  in  individual, group and milieu therapy, was placed on 400 hall for closer  monitoring and clarification of medications and medical follow-up were  obtained.  The patient was monitored closely due to her history of multiple  medical problems as she was stabilized on medications and support system was  mobilized to assist with discharge planning.  The patient at one point  appeared to improve, began having increased p.o. intake, then became more  confused again.  Internal medicine saw the patient regarding medical status  and [facility] and all recommendations followed.  The patient felt  clearly delusional, that she was being poisoned.  At times would not take  the medications due to believing that the nurses were conspiring against her  due to jealousy out of one head nurse who lived with a man who she had had  an affair with in the past.  The patient episodically had worsening of  delusional thinking, confusion and difficulty sleeping, however she  gradually was stabilized on medications and her thought process became more  reality based.  Judgment and insight improved.  She still had some paranoid  thoughts at times which waxed and waned but was easily redirectable and was  quite pleasant.  Appropriate placement was found with case management  assistance working with the patient and support system and the patient was  discharged in improved condition on medications:  1.  Risperdal 0.5 mg 1/2 in the morning, 1/2 4 p.m. and 1-1/2 at 9 p.m.  2.  Zoloft 100 mg daily  3.  Xanax 0.25 mg at 9 p.m. and 2 times a day as needed for anxiety.  4.  Tylenol 325 1 q.6 p.r.n. pain.  5.  Cogentin 0.5 mg q.12 p.r.n. EPS.  6.  Tenormin 25 mg in the morning.  7.  Protonix 40 mg in the morning.  8.  Septra b.i.d. to take as directed to complete a course.  9.  Ambien 10 mg q.h.s.  10. Avapro 150 mg 1/2 in the morning.  11. Glucotrol 5 mg q.a.m.  12. Procardia XL 50 mg q.a.m.  13. Lasix 20 mg q.a.m.   14. Glucophage 500 mg q.h.s.  15. Lipitor 40 mg q.9 p.m.   DISPOSITION:  The patient was discharged to follow up with Dr. Betti Cruz on  January 9 at 9:30.   DISCHARGE DIAGNOSES:   AXIS I:  1.  Psychotic disorder not otherwise specified, rule out delirium.  2.  History of major depressive disorder, recurrent, severe, with psychotic      features.   AXIS II:  Deferred.   AXIS III:  Irritable bowel syndrome, coronary artery disease and elevated  cholesterol, non-insulin-dependent diabetes mellitus and gastroesophageal  reflux disease.   AXIS IV:  Severe problems with primary support group, medical problems,  other psychosocial issues.   AXIS V:  Global assessment of function on discharge was 55.      JEM/MEDQ  D:  04/16/2004  T:  04/16/2004  Job:  [id number]

## 2010-08-04 NOTE — Consult Note (Signed)
Meghan Porter, Meghan Porter               ACCOUNT NO.:  [id number]   MEDICAL RECORD NO.:  [id number]          PATIENT TYPE:  INP   LOCATION:  1605                         [facility]ACILITY:  [facility]   PHYSICIAN:  Michael L. Reynolds, M.D.DATE OF BIRTH:  02/23/1922   DATE OF CONSULTATION:  07/24/2006  DATE OF DISCHARGE:                                 CONSULTATION   REASON FOR EVALUATION:  Altered mental status.   HISTORY OF PRESENT ILLNESS:  This is the initial inpatient consultation  evaluation of this 77 year old woman with a past medical history which  includes dementia, psychosis, and several other medical problems as  outlined below.  The patient was seen by her primary physician, Dr.  Jarold Motto, on Thursday.  At that time was started on Aricept 5 mg daily.  The patient's daughter reports that she usually puts her mother's  medications in a box and states that she did not have the right number  of pills missing when she put the medications in the box and believes  she only took 2 tablets rather than the expected 4 over that week.  When  the patient's daughter checked on the patient Monday, she seemed to be a  little bit weak, and so, she decided to hold the Aricept.  However, the  next day which was yesterday, the patient seemed still more weak and  seemed to have difficulty putting sentences together.  She was unsteady  to the point that she was not able to negotiate the steps at her house  and had one fall.  Because of this concern, the patient's daughter  brought her to the Emergency Room, and she was subsequently admitted for  further considerations.  She has had some routine labs although she has  not had a urinalysis.  She also had MRI of the brain which is available  for my review.  The patient says that she is feeling better this  morning.  She was able to get up today with the physical therapist and  required minimal to moderate assist but was able to ambulate 70 feet  with a rolling  walker.  A neurology consultation is requested for  further considerations.  The patient's daughter says that she has had  several discussions with the patient about moving to a more supervised  living situation, but the patient has declined repeatedly.   PAST MEDICAL HISTORY:  She has dementia with a history of paranoid  delusions for which she is followed on an outpatient basis by Dr. Betti Cruz.  She was hospitalized for this in 2006.  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  She has a history of recurrent major depression.  She has presumed heart  dyskinesia related to use of antipsychotic medications.  Other medical  problems include hypertension, hyperlipidemia, diabetes, coronary artery  disease status post stenting.   FAMILY/SOCIAL/REVIEW OF SYSTEMS:  As outlined in admission H&P by Dr.  Jarold Motto from yesterday which is reviewed.   MEDICATIONS:  Prior to admission, she was taking:   1. Atenolol.  2. Avalide.  3. Protonix.  4. Glucophage.  5. Zoloft 100 mg daily.  6. Cogentin 1 mg daily.  7. Depakote ER 250 mg daily.  8. Restoril 15 mg q.h.s.  9. Lipitor.  10.Risperdal 1 mg q.p.m.  11.Baby aspirin.  12.Multivitamin.  13.ICAPS.  14.She also had the Aricept started and stopped as noted above.   It appears that her medications in the hospital have for the most part  been continued, and she has also been started on Lovenox.   PHYSICAL EXAMINATION:  VITAL SIGNS:  Temperature 97.8, blood pressure  203/79, pulse 72, respirations 18, O2 99% on 2 liters nasal cannula, CBG  169.  GENERAL:  This is an elderly-appearing woman seated in a chair in no  distress.  HEAD:  Cranium normocephalic and atraumatic.  Oropharynx benign.  NECK:  Supple without carotid or supraclavicular bruits.  HEART:  Regular rate and rhythm without murmurs.  NEUROLOGICAL:  Mental status:  She is awake and alert.  She is oriented  to place  but cannot tell me the year.  She performs poorly on  concentration and short-term memory tasks, but long-term memory seems to  be intact.  She is able to name objects and repeat phrases.  She seems  to occasionally drift off and have difficulty completing a thought.  Mood is euthymic and affect appropriate.  Cranial nerves:  Pupils are  equal and reactive.  Extraocular movements full without nystagmus.  Visual fields are grossly full, but she seems to have trouble counting  fingers.  Hearing is intact to conversational speech.  Face, tongue, and  palate move normally and symmetrically.  Shoulder shrug strength is  normal.  Motor testing:  Normal bulk and tone.  Normal strength while  testing extremity muscles.  Sensation is intact to light touch in all  extremities.  Coordination finger-to-nose performed accurately  bilaterally.  Gait is deferred, although the PT assessment is reviewed  as noted above.  Reflexes are diminished throughout.  Toes are  downgoing.   LABORATORY REVIEW:  CBC:  White count 7.5, hemoglobin 12.3, platelets  283,000.  CMET is remarkable for elevated BUN and creatinine at 33 and  1.37 respectively, baseline unknown.  Glucose elevated at 145.  Liver  functions are normal.  Coags are normal.  TSH is normal.  MRI of the  brain is personally reviewed, and the study is remarkable for atrophy  and white matter disease with multiple lacunar infarcts.  There is no  acute abnormality.  She has not had a recent chest x-ray or urinalysis.   IMPRESSION:  Transient encephalopathy likely secondary to medications.  Need to rule out other factors including urinary tract infection,  possible contribution of renal insufficiency.  She seems improved today  over yesterday.   RECOMMENDATIONS:  Would add daily dose of thiamine.  She is to have her  medication simplified.  I would recommend discontinuation of the Cogentin as possible and reduction or discontinuation in the  Risperdal.  Would also suggest ongoing physical and occupational therapy.  Her  daughter would really like to pursue placement options, so I will have  the social worker/case manager speak with her about that.  Would also  agree with having a psychiatrist input at this point.  Thank you for  this consultation.      Michael L. Thad Ranger, M.D.  Electronically Signed     MLR/MEDQ  D:  07/24/2006  T:  07/24/2006  Job:  [id number]

## 2010-08-04 NOTE — Consult Note (Signed)
Meghan Porter, Meghan Porter                         ACCOUNT NO.:  [id number]   MEDICAL RECORD NO.:  [id number]                   PATIENT TYPE:  INP   LOCATION:  4731                                 FACILITY:  [facility]   PHYSICIAN:  robert buccini                      DATE OF BIRTH:  [birth year]   DATE OF CONSULTATION:  DATE OF DISCHARGE:                                   CONSULTATION   HISTORY OF PRESENT ILLNESS:  Dr. Elsie Lincoln, covering for Dr. Nanetta Batty,  asked me to see this 77 year old female because of noncardiac chest pain.  The patient is known to my partner, Dr. Leary Roca, who has done a  colonoscopy on her in the past which apparently showed a colonic adenoma.   She was admitted to the hospital yesterday with a several month history of  left pectoral soreness and discomfort, for  which a breast evaluation was  negative. She  began to have intensified symptoms, including some nocturnal  awakening and was admitted yesterday for a cardiac workup. The pain seemed  to improve with use of nitroglycerin. Of note, the patient has a history of  reflux disease, for which she finds that Zantac seems to work more  effectively than Prilosec. Her main manifestation occurred in the past has  been a burning sensation in the retrosternal area and also some rare  transient dysphagia symptoms, but she has been essentially free of the  burning symptoms for the past year   Also of note is the fact that the patient was  treated with external beam  radiation therapy for non Hodgkin's lymphoma about five years ago. Although  apparently the primary focus was in the left submandibular region, she did  receive several fields of radiation, including  one which involved the  center of her chest.   The patient denies odynophagia or any significant dysphagia at this time and  the above mentioned chest pain symptoms do not seem to have been related to  meals.   PAST MEDICAL HISTORY:  Possible intolerance  to aspirin.   OUTPATIENT MEDICATIONS:  1. Baby aspirin.  2. Nifedipine.  3. Zoloft.  4. Furosemide.  5. Lipitor.  6. Alprazolam.  7. Atenolol.  8. Ranitidine 150 b.i.d.  9. Glucophage.  10.      Vitamin supplements.   PAST SURGICAL HISTORY:  Status post PTCA about five years ago. I do not  believe there has been any abdominal surgery.   MEDICAL ILLNESSES:  1. Coronary disease, status post PTCA.  2. Type 2 diabetes of approximately 12 years duration, well controlled by     the patient's report.  3. History of hypertension.  4. No COPD.  5. To my knowledge he has never had an MI.   HABITS:  Nonsmoker, nondrinker.   FAMILY HISTORY:  Not obtained.   SOCIAL HISTORY:  Lives  alone, widowed.  Retired Catering manager.   REVIEW OF SYSTEMS:  The patient admits to various aches and pains scattered  in her abdomen which are independent of the  above mentioned chest pain  symptoms and do not seem to be related to meals or defecation. Over the past  year, her bowel habits have changed to  being quite regular to being  somewhat irregular, sometimes having  several days of diarrhea, then being  relatively constipated. This is currently attributed to the use of  Glucophage.   PHYSICAL EXAMINATION:  GENERAL:  An overweight, pleasant, articulate white  female in no evident distress . Neither anxious or depressed.  HEENT:  She is anicteric without frank pallor.  CHEST:  Clear.  HEART:  Without murmurs, rubs, gallops, clicks or arrhythmias.  ABDOMEN: Quite obese but soft and nontender without guarding or masses.   IMPRESSION:  1. Nonspecific chest pain symptoms which do not seem to be clearly related     to the esophagus, either in their location (left pectoral instead of     retrosternal), or in terms of their character (no relation to     swallowing).  2. History of GERD with relatively quiescent symptoms  over the past year.  3. Prior history of adenomatous polyp, removed several years  ago, apparently     due for followup in about two years.   DISCUSSION AND PLAN:  I  think that endoscopic evaluation is reasonable in  this  patient because of two risk factors. First esophageal disease (GERD  and radiation  exposure). It is unclear that the patient's symptoms would  correlate with any esophageal problem, but I  think that if we could clear  the esophagus, it would be a good first step in trying to sort out why the  patient had pain of sufficient severity to prompt hospitalization. The  patient herself does not seem to be all that bothered by the symptoms, but  rather was advised to come in by the cardiologist when she called her office  and described them. None the less, I think that trying to better understand  what is causing the symptoms would be advisable.   The nature, purpose and risks of an upper endoscopy were discussed with the  patient, as well as options of  having  it done tomorrow prior to discharge,  or having  it done electively as an outpatient,  following discussion with  Dr. Brunilda Payor and Dr. Ewing Schlein. She preferred to go ahead and take care of  it while she was here which I think is reasonable. Further management will  depend on the endoscopic findings and the patient's clinical evolution.                                               robert buccini    RB/MEDQ  D:  11/07/2001  T:  11/10/2001  Job:  [id number]   cc:   Runell Gess, M.D.   Brunilda Payor, M.D.   Lowell C. Catha Gosselin, M.D.  [address] [facility]  [place]  [place] [address]  Fax: [phone]

## 2010-08-04 NOTE — H&P (Signed)
NAMEKOOPER, CHRISWELL NO.:  [id number]   MEDICAL RECORD NO.:  [id number]          PATIENT TYPE:  EMS   LOCATION:  ED                           FACILITY:  [facility]   PHYSICIAN:  Barry Dienes. [name], M.D.DATE OF BIRTH:  August 30, 1921   DATE OF ADMISSION:  07/23/2006  DATE OF DISCHARGE:                              HISTORY & PHYSICAL   CHIEF COMPLAINT:  Unable to walk and feeling weak.   HISTORY OF PRESENT ILLNESS:  The patient is an 77 year old white woman  who is well-known to me as I serve as her primary care physician.  Last  week she was started on Aricept 5 mg daily due to worsening confusion  and forgetfulness.  She was at her office visit with her daughter and it  was advised that if she has any problems with the Aricept that she  should stop the medicine and call us.  Initially, she had been doing  fine on Aricept 5 mg every p.m.  Yesterday she was somewhat weak with  dysphoria, so Aricept was stopped.  Today when her daughter went to see  her after work, she noted that she had increasing confusion with  difficulty expressing words and a great deal of difficulty with walking  with a very shuffled gait and a minor fall in her presence without  injury.  She presented to the [facility] for evaluation.   PAST MEDICAL HISTORY:  1. Dementia with paranoia that led to a 2006 hospital admission.  2. Diabetes mellitus, type 2, controlled.  3. Non-Hodgkin's lymphoma of the neck.  It was early stage and treated      with radiation therapy with a followup PET scan in 2004 negative.  4. Recurrent major depressive disorder.  5. 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  6. Coronary artery disease, status post 1998 PTCA and stent of the      right coronary artery and 2003 cardiac catheterization showing only      40% proximal LAD stenosis with 30% mid RCA stenosis and normal left      ventricular systolic function.  7. Hyperlipidemia.  8.  Hypertension.  9. Gastroesophageal reflux disease.  10.Irritable bowel syndrome.  11.Colon polyps noted in February 2005 colonoscopy.   MEDICATIONS PRIOR TO ADMISSION:  1. Atenolol 25 mg daily.  2. Avalide 150 mg daily.  3. Protonix 40 mg daily.  4. Glucophage XR 500 mg daily.  5. Zoloft 100 mg daily.  6. Cogentin 1 mg daily.  7. Depakote ER 250 mg daily.  8. Restoril 15 mg p.o. nightly.  9. Lipitor 20 mg daily.  10.Risperdal 1 mg p.o. q.p.m.  11.Aspirin 81 mg daily.  12.Multivitamin 1 tablet daily.  13.ICaps 1 tab daily.  14.Aricept 5 mg every p.m. was started on May 1 and stopped on May 5.   ALLERGIES:  NO KNOWN DRUG ALLERGIES, HIGH DOSE ASPIRIN HAS BEEN  ASSOCIATED WITH MILD GI UPSET.   PAST SURGICAL HISTORY:  1. In the 1990s she had a neck biopsy showing non-Hodgkin's lymphoma,      followed by local radiation therapy.  2. 1998 PTCA with stent in the right coronary artery.  3. 2003 cardiac catheterization.   FAMILY HISTORY:  Noncontributory.   SOCIAL HISTORY:  She is widowed and lives alone.  Her daughter stops by  to see her at least on a daily basis.  She has no history of tobacco or  alcohol abuse.   REVIEW OF SYSTEMS:  Lately she has had somewhat decreased appetite and  increased weakness and increased difficulty saying what she wants to  say.  She has not had recent fever, chills, nausea, vomiting, diarrhea,  constipation, shortness of breath, cough, chest pain, palpitations,  seizures, dysuria, frequency, or depression.  She has chronic mild  anxiety and walks with a cane generally.   INITIAL PHYSICAL EXAM:  VITAL SIGNS:  Blood pressure 159/81, pulse 76,  respirations 18, temperature 98.1, pulse oxygen saturation 95%.  GENERAL:  In general, she is an elderly white female who is in no  apparent distress while lying fully supine in bed.  HEENT EXAM:  Pupils were equal, round and react to light and  accommodation.  Extraocular movements were intact.  NECK:   Supple without jugular venous distension or carotid bruit.  CHEST:  Clear to auscultation.  CARDIAC:  Heart had a regular rate and rhythm without significant murmur  or gallop.  ABDOMEN:  Abdomen had normal bowel sounds and no hepatosplenomegaly or  tenderness.  EXTREMITIES:  Extremities had bilateral 1+ leg edema.  Pedal pulses were  1+ bilaterally.  NEUROLOGICAL EXAM:  She was alert and mildly anxious with mild word-  finding difficulty.  She had her usual rest tremor in both upper  extremities.  She was able to move all 4 extremities with normal  strength.  She had intact finger-to-nose testing on the right and the  left; this was difficult to do with an IV in the antecubital space.  Gait was not assessed at this time.   LABORATORY STUDIES:  White blood cell count 7.5, hemoglobin 12.3,  hematocrit 36, platelets 283.  Serum sodium 137, potassium 3.4, chloride  98, carbon dioxide 29, BUN 33, creatinine 1.37, glucose 145, total  protein 6.9 albumin 4.1, bilirubin 1.0, calcium 9.4, estimated  glomerular filtration rate 45.   A CT scan of the head without contrast showed no acute abnormalities.   EKG telemetry showed a normal sinus rhythm.   IMPRESSION AND PLAN:  1. Gait instability with expressive aphasia:  This is most likely due      to recently added Aricept which could cause muscle weakness or      other subtle neurologic signs.  I doubt that she has had a new      stroke given the lack of other new deficits other than word-finding      difficulty.  I plan to continue the discontinuation of Aricept.  We      will also have her stay on telemetry, obtain an MRI scan of the      brain to rule out a new stroke, have a physical therapy assessment      and initiation of gait training.  In addition, we will ask a      neurologist for a consultation tomorrow.  2. Diabetes mellitus, type 2:  Stable on her oral medications.     Currently, her creatinine level is somewhat borderline for  using      Glucophage.  However, eventually when she returns to home, the risk      of using an agent  such as Glucotrol that could cause severe      hypoglycemia seems higher than the risk of using low-dose      Glucophage with borderline renal function.  For now, we will use      sliding-scale insulin as needed and hold Glucophage.  We will      recheck a BMET test with a BUN and creatinine during this      hospitalization.  3. Coronary artery disease:  Stable on her current medical regimen.  4. Dementia with paranoia:  She has somewhat tenuous control of her      psychotic thinking.  Her family is somewhat uncomfortable with the      FDA warnings associated with Risperdal in the elderly.  I discussed      with her daughter that, although use of Risperdal in her mother is      off-label, that is commonly done as it is unlikely that other      agents would diminish her paranoid ideation.  I plan to request a      psychiatric consultation regarding a review of her psychiatric      medical regimen, as well as capacity for informed consent as she      may be transferring to a rehabilitation facility soon, something      that she had not been in favor of in the past.           ______________________________  Barry Dienes. [name], M.D.     DGP/MEDQ  D:  07/23/2006  T:  07/24/2006  Job:  [id number]   cc:   Daine Floras, M.D.  Fax: [phone]   Petra Kuba, M.D.  Fax: [phone]   Nanetta Batty, M.D.  Fax: [phone]   Rose Phi. Myna Hidalgo, M.D.  Fax: [phone]

## 2010-08-04 NOTE — Consult Note (Signed)
Meghan Porter, PENICK               ACCOUNT NO.:  [id number]   MEDICAL RECORD NO.:  [id number]          PATIENT TYPE:  INP   LOCATION:  1605                         FACILITY:  [facility]   PHYSICIAN:  Antonietta Breach, M.D.  DATE OF BIRTH:  1921-08-25   DATE OF CONSULTATION:  07/25/2006  DATE OF DISCHARGE:  07/26/2006                                 CONSULTATION   Ms. Eye has continued with breakthrough severe agitation.  She  required a milligram of Ativan this morning for an agitation episode.   She is not having any adverse psychotropic effects.  The agitation  periods are interfering with her bedside medical care.   REVIEW OF SYSTEMS:  There are no thoughts of harming herself or others.  She is not experiencing hallucinations at this time.   LABORATORY DATA:  The urinalysis was unremarkable.   EXAMINATION:  VITAL SIGNS:  Temperature 97.4, pulse 64, respiration 18,  blood pressure 186/107, O2 saturation on room air 96%.   MENTAL STATUS EXAM:  [name] continues with partial confabulation on  thought process.  She has been limited orientation to time but is  oriented to place.  She continues with impaired judgment and poor  insight.  Her affect is agitated.  Her mood is irritable.  She is alert.  Her concentration is decreased.  There is no disorganized speech or easy  distractibility.   ASSESSMENT:  293.82, psychotic disorder not otherwise specified with  hallucinations.  293.83, mood disorder due to dementia.   RECOMMENDATIONS:  Would increase the Depakote to 500 mg every night and  titrate by 250 to 500 mg per day to the dosage which becomes effective  in preventing severe agitation.   The estimated dose for Depakote that will be effective is somewhere  between 750 mg every night and 1500 mg every night.   Would check a CBC and liver function panel periodically to screen for  adverse Depakote effects.   No change in her other psychotropic medications at this time.   PRELIMINARY DISCHARGE PLANNING:  The patient will need a follow-up with  her psychiatrist during the first week of discharge.      Antonietta Breach, M.D.  Electronically Signed     JW/MEDQ  D:  09/25/2006  T:  09/26/2006  Job:  [id number]

## 2010-12-22 LAB — URINALYSIS, ROUTINE W REFLEX MICROSCOPIC
Bilirubin Urine: NEGATIVE
Hgb urine dipstick: NEGATIVE
Ketones, ur: NEGATIVE
Nitrite: NEGATIVE
Specific Gravity, Urine: 1.022
Urobilinogen, UA: 0.2

## 2010-12-22 LAB — URINE CULTURE: Colony Count: >=100000

## 2010-12-22 LAB — URINE MICROSCOPIC-ADD ON

## 2010-12-22 LAB — CBC
HCT: 32.3 — ABNORMAL LOW
MCHC: 35.9
Platelets: 238
RDW: 13

## 2010-12-22 LAB — COMPREHENSIVE METABOLIC PANEL
ALT: 14
AST: 21
Alkaline Phosphatase: 65
Chloride: 106
GFR calc Af Amer: 56 — ABNORMAL LOW
Sodium: 142
Total Protein: 5.7 — ABNORMAL LOW

## 2010-12-22 LAB — POCT CARDIAC MARKERS: Troponin i, poc: <0.05

## 2010-12-22 LAB — TRICYCLICS SCREEN, URINE: TCA Scrn: NOT DETECTED

## 2010-12-22 LAB — DIFFERENTIAL
Basophils Absolute: 0.1
Eosinophils Absolute: 0.3
Lymphocytes Relative: 24
Monocytes Relative: 13 — ABNORMAL HIGH
Neutrophils Relative %: 57

## 2010-12-22 LAB — RAPID URINE DRUG SCREEN, HOSP PERFORMED
Barbiturates: NOT DETECTED
Cocaine: NOT DETECTED
Tetrahydrocannabinol: NOT DETECTED

## 2010-12-22 LAB — PROTIME-INR: Prothrombin Time: 14.1

## 2010-12-22 LAB — APTT: aPTT: 35

## 2010-12-26 LAB — DIFFERENTIAL
Basophils Absolute: 0.2 — ABNORMAL HIGH
Eosinophils Absolute: 0.3
Monocytes Absolute: 1
Neutro Abs: 3.3
Neutrophils Relative %: 51

## 2010-12-26 LAB — URINALYSIS, ROUTINE W REFLEX MICROSCOPIC
Bilirubin Urine: NEGATIVE
Hgb urine dipstick: NEGATIVE
Urobilinogen, UA: 0.2
pH: 6

## 2010-12-26 LAB — COMPREHENSIVE METABOLIC PANEL
Albumin: 3.8
Alkaline Phosphatase: 59
CO2: 28
GFR calc Af Amer: 55 — ABNORMAL LOW
GFR calc non Af Amer: 46 — ABNORMAL LOW
Sodium: 141
Total Protein: 6.6

## 2010-12-26 LAB — URINE MICROSCOPIC-ADD ON

## 2010-12-26 LAB — CBC
Hemoglobin: 12.7
MCHC: 34.8
RDW: 13.3

## 2013-03-30 ENCOUNTER — Non-Acute Institutional Stay (SKILLED_NURSING_FACILITY): Payer: Medicare Other | Admitting: Internal Medicine

## 2013-03-30 DIAGNOSIS — I509 Heart failure, unspecified: Secondary | ICD-10-CM

## 2013-03-30 DIAGNOSIS — I2510 Atherosclerotic heart disease of native coronary artery without angina pectoris: Secondary | ICD-10-CM

## 2013-03-30 DIAGNOSIS — F0390 Unspecified dementia without behavioral disturbance: Secondary | ICD-10-CM

## 2013-03-30 DIAGNOSIS — E1149 Type 2 diabetes mellitus with other diabetic neurological complication: Secondary | ICD-10-CM

## 2013-04-06 ENCOUNTER — Non-Acute Institutional Stay (SKILLED_NURSING_FACILITY): Payer: Medicare Other | Admitting: Internal Medicine

## 2013-04-06 DIAGNOSIS — D7589 Other specified diseases of blood and blood-forming organs: Secondary | ICD-10-CM

## 2013-04-06 DIAGNOSIS — D7289 Other specified disorders of white blood cells: Secondary | ICD-10-CM

## 2013-04-06 DIAGNOSIS — D649 Anemia, unspecified: Secondary | ICD-10-CM

## 2013-04-10 DIAGNOSIS — D7289 Other specified disorders of white blood cells: Secondary | ICD-10-CM | POA: Insufficient documentation

## 2013-04-10 DIAGNOSIS — D649 Anemia, unspecified: Secondary | ICD-10-CM | POA: Insufficient documentation

## 2013-04-10 DIAGNOSIS — D7589 Other specified diseases of blood and blood-forming organs: Secondary | ICD-10-CM | POA: Insufficient documentation

## 2013-04-10 NOTE — Progress Notes (Signed)
         PROGRESS NOTE  DATE: 04/06/2013  FACILITY[facility]  LEVEL OF CARE: SNF (31)  Acute Visit  CHIEF COMPLAINT:  Manage anemia and leukocytosis  HISTORY OF PRESENT ILLNESS: I was requested by the staff to assess the patient regarding above problem(s):  ANEMIA: The anemia is been unstable. The staff deny melena or hematochezia. On 04-02-13 hemoglobin 9.7, MCV 105.4. On 03-24-13 hemoglobin 11.3, MCV 94.3. Patient is a poor historian.  LEUCOCYTOSIS: New problem. On 04-02-13 WBC 13.6. On 03-24-13 WBC 9.7. Patient is not on prednisone. Staff do not report cough or fever.  PAST MEDICAL HISTORY : Reviewed.  No changes.  [facility]  REVIEW OF SYSTEMS: Unobtainable due to patient being a poor historian  PHYSICAL EXAMINATION  GENERAL: no acute distress, normal body habitus EYES: conjunctivae normal, sclerae normal, normal eye lids NECK: supple, trachea midline, no neck masses, no thyroid tenderness, no thyromegaly LYMPHATICS: no LAN in the neck, no supraclavicular LAN RESPIRATORY: breathing is even & unlabored, BS CTAB CARDIAC: RRR, no murmur,no extra heart sounds, no edema GI: abdomen soft, normal BS, no masses, no tenderness, no hepatomegaly, no splenomegaly PSYCHIATRIC: the patient is alert & disoriented, affect & behavior appropriate  LABS/RADIOLOGY: See history of present illness  ASSESSMENT/PLAN:  Anemia-unstable problem. Hemoglobin declined. Recheck. Leukocytosis-new problem. Patient is asymptomatic. Recheck Macrocytosis-new problem. Check RBC folate and vitamin B12 level.  CPT CODE: [id number]

## 2013-04-19 DEATH — deceased

## 2013-05-06 DIAGNOSIS — F0390 Unspecified dementia without behavioral disturbance: Secondary | ICD-10-CM | POA: Insufficient documentation

## 2013-05-06 DIAGNOSIS — E1149 Type 2 diabetes mellitus with other diabetic neurological complication: Secondary | ICD-10-CM | POA: Insufficient documentation

## 2013-05-06 DIAGNOSIS — I509 Heart failure, unspecified: Secondary | ICD-10-CM | POA: Insufficient documentation

## 2013-05-06 DIAGNOSIS — I2510 Atherosclerotic heart disease of native coronary artery without angina pectoris: Secondary | ICD-10-CM | POA: Insufficient documentation

## 2013-05-06 NOTE — Progress Notes (Signed)
Patient ID: Meghan Porter, female   DOB: 05-15-1921, 80 y.o.   MRN: [id number]               HISTORY & PHYSICAL  DATE: 03/30/2013     FACILITY: [facility]  LEVEL OF CARE: SNF (31)  ALLERGIES:   Aspirin.    CHIEF COMPLAINT:  Manage CHF, dementia, and diabetes mellitus.    HISTORY OF PRESENT ILLNESS:  The patient is an 80 year-old, Caucasian female who was hospitalized secondary to dehydration and acute renal failure.  After hospitalization, she is admitted to this facility for short-term rehabilitation.    CHF:The patient does not relate significant weight changes, denies sob, DOE, orthopnea, PNDs, pedal edema, palpitations or chest pain.  CHF remains stable.  No complications form the medications being used.    DEMENTIA: The dementia remains stable and continues to function adequately in the current living environment with supervision.  The patient has had little changes in behavior. No complications noted from the medications presently being used.   DM:pt's DM remains stable.  Pt denies polyuria, polydipsia, polyphagia, changes in vision or hypoglycemic episodes.  No complications noted from the medication presently being used.  Last hemoglobin A1c is:  Not available.    PAST MEDICAL HISTORY :   Coronary artery disease.    CHF, EF of 15-20%.    Atrial fibrillation.    Dementia.    Moderate mitral regurgitation.    Hypertension.    Diabetes mellitus.    History of non-Hodgkin's lymphoma.    Osteoarthritis.    PAST SURGICAL HISTORY:  Cataract surgery.    SOCIAL HISTORY: TOBACCO USE:  Denies tobacco use. ALCOHOL:  Denies alcohol use. ILLICIT DRUGS:  Denies illicit drug use.    FAMILY HISTORY: Patient cannot recall.    [facility]: [facility]  REVIEW OF SYSTEMS:  See HPI otherwise 14 point ROS is negative.  PHYSICAL EXAMINATION  VS:  T 98.5       P 54      RR 20      BP 104/60      GENERAL: no acute distress, normal  body habitus EYES: conjunctivae normal, sclerae normal, normal eye lids MOUTH/THROAT: lips without lesions,no lesions in the mouth,tongue is without lesions,uvula elevates in midline NECK: supple, trachea midline, no neck masses, no thyroid tenderness, no thyromegaly LYMPHATICS: no LAN in the neck, no supraclavicular LAN RESPIRATORY: breathing is even & unlabored, BS CTAB CARDIAC: RRR, no murmur,no extra heart sounds, no edema GI:  ABDOMEN: abdomen soft, normal BS, no masses, no tenderness  LIVER/SPLEEN: no hepatomegaly, no splenomegaly MUSCULOSKELETAL: HEAD: normal to inspection & palpation BACK: no kyphosis, scoliosis or spinal processes tenderness EXTREMITIES: LEFT UPPER EXTREMITY: full range of motion, normal strength & tone RIGHT UPPER EXTREMITY:  full range of motion, normal strength & tone LEFT LOWER EXTREMITY:  full range of motion, normal strength & tone RIGHT LOWER EXTREMITY:  full range of motion, normal strength & tone PSYCHIATRIC: the patient is alert & oriented to person, affect & behavior appropriate  LABS/RADIOLOGY: WBC 11.7, hemoglobin 11.3, platelets 225.    Urinalysis negative.    Calcium 8.9, sodium 128, potassium 3.2, chloride 85, CO2 30, glucose 153, BUN 55, creatinine 1.91.  CT of the head:  No acute findings.    Chest x-ray:  Improved left lower lobe aeration.    EKG:  Showed sinus rhythm.  No ST-T wave abnormalities.   QT corrected for 86 milliseconds  ASSESSMENT/PLAN:  CHF.  Well compensated.    Dementia.  Stable.    Diabetes mellitus.  Continue current medications.    CAD.  Stable.    Peripheral neuropathy.  Continue gabapentin.     GERD.  Well controlled.     Hypertension.  Well controlled.    Check CBC and BMP.    I have reviewed patient's medical records received at admission/from hospitalization.  CPT CODE: 16109     Angela Cox, MD [facility] [phone]
